# Patient Record
Sex: Female | Born: 1940 | Race: White | Hispanic: No | Marital: Married | Smoking: Former smoker
Health system: Southern US, Community
[De-identification: ages and names within clinical notes are randomized; demographics above are authoritative.]

## PROBLEM LIST (undated history)

## (undated) HISTORY — PX: COSMETIC SURGERY: SHX468

---

## 1989-05-01 HISTORY — PX: TUBAL LIGATION: SHX77

## 1989-05-01 HISTORY — PX: KNEE SURGERY: SHX244

## 2015-03-10 DIAGNOSIS — H40002 Preglaucoma, unspecified, left eye: Secondary | ICD-10-CM | POA: Diagnosis not present

## 2015-03-10 DIAGNOSIS — H179 Unspecified corneal scar and opacity: Secondary | ICD-10-CM | POA: Diagnosis not present

## 2015-03-10 DIAGNOSIS — H2513 Age-related nuclear cataract, bilateral: Secondary | ICD-10-CM | POA: Diagnosis not present

## 2015-03-10 DIAGNOSIS — H43392 Other vitreous opacities, left eye: Secondary | ICD-10-CM | POA: Diagnosis not present

## 2015-06-28 DIAGNOSIS — M9903 Segmental and somatic dysfunction of lumbar region: Secondary | ICD-10-CM | POA: Diagnosis not present

## 2015-06-28 DIAGNOSIS — M9901 Segmental and somatic dysfunction of cervical region: Secondary | ICD-10-CM | POA: Diagnosis not present

## 2015-06-28 DIAGNOSIS — M5413 Radiculopathy, cervicothoracic region: Secondary | ICD-10-CM | POA: Diagnosis not present

## 2015-06-28 DIAGNOSIS — M5432 Sciatica, left side: Secondary | ICD-10-CM | POA: Diagnosis not present

## 2015-07-09 DIAGNOSIS — M9901 Segmental and somatic dysfunction of cervical region: Secondary | ICD-10-CM | POA: Diagnosis not present

## 2015-07-09 DIAGNOSIS — M5432 Sciatica, left side: Secondary | ICD-10-CM | POA: Diagnosis not present

## 2015-07-09 DIAGNOSIS — M5413 Radiculopathy, cervicothoracic region: Secondary | ICD-10-CM | POA: Diagnosis not present

## 2015-07-09 DIAGNOSIS — M9903 Segmental and somatic dysfunction of lumbar region: Secondary | ICD-10-CM | POA: Diagnosis not present

## 2015-07-23 DIAGNOSIS — M5413 Radiculopathy, cervicothoracic region: Secondary | ICD-10-CM | POA: Diagnosis not present

## 2015-07-23 DIAGNOSIS — M5432 Sciatica, left side: Secondary | ICD-10-CM | POA: Diagnosis not present

## 2015-07-23 DIAGNOSIS — M9901 Segmental and somatic dysfunction of cervical region: Secondary | ICD-10-CM | POA: Diagnosis not present

## 2015-07-23 DIAGNOSIS — M9903 Segmental and somatic dysfunction of lumbar region: Secondary | ICD-10-CM | POA: Diagnosis not present

## 2015-12-18 ENCOUNTER — Emergency Department (INDEPENDENT_AMBULATORY_CARE_PROVIDER_SITE_OTHER)
Admission: EM | Admit: 2015-12-18 | Discharge: 2015-12-18 | Disposition: A | Discharge status: Home / Self Care | Payer: Self-pay | Source: Home / Self Care | Attending: Family Medicine | Admitting: Family Medicine

## 2015-12-18 ENCOUNTER — Encounter: Payer: Self-pay | Admitting: Emergency Medicine

## 2015-12-18 DIAGNOSIS — M1990 Unspecified osteoarthritis, unspecified site: Secondary | ICD-10-CM

## 2015-12-18 MED ORDER — SALSALATE 500 MG PO TABS: 1000.0000 mg | ORAL_TABLET | Freq: Two times a day (BID) | ORAL | 1 refills | Status: AC

## 2015-12-18 MED ORDER — TRIAMCINOLONE ACETONIDE 40 MG/ML IJ SUSP
40.0000 mg | Freq: Once | INTRAMUSCULAR | Status: AC
  Administered 2015-12-18: 40 mg via INTRAMUSCULAR

## 2015-12-18 NOTE — ED Triage Notes (Signed)
Pt c/o recurrent RA flare.  Pt is having joint pain all over, pain in left hand, pain in her knees, no appetite and no energy.

## 2015-12-18 NOTE — ED Provider Notes (Signed)
[facility]    CSN: [id number] Arrival date & time: 12/18/15  1641  First Provider Contact:  First MD Initiated Contact with Patient 12/18/15 1723        History   Chief Complaint Chief Complaint  Patient presents with  . Joint Pain    HPI Leslie Guzman is a 75 y.o. female.   Patient complains of 2.5 week history of bilateral knee pain/swelling, and soreness in her upper back and neck after experiencing increased stress.  She states that she had similar arthritis about 20 years ago that was diagnosed by a rheumatologist as having rheumatoid arthritis.  Her symptoms at that time resolved after a single steroid injection, and she continued naturopathic treatments without development of further symptoms.  She states that she had a similar flare-up about 5 years later that again responded to a steroid shot.  She complains of increased fatigue during the past several weeks   The history is provided by the patient.    History reviewed. No pertinent past medical history.  There are no active problems to display for this patient.   Past Surgical History:  Procedure Laterality Date  . COSMETIC SURGERY    . KNEE SURGERY  1991  . TUBAL LIGATION  1991    OB History    No data available       Home Medications    Prior to Admission medications   Medication Sig Start Date End Date Taking? Authorizing Provider  salsalate (DISALCID) 500 MG tablet Take 2 tablets (1,000 mg total) by mouth 2 (two) times daily. Take with food 12/18/15   Kandra Nicolas, MD    Family History No family history on file.  Social History Social History  Substance Use Topics  . Smoking status: Former Research scientist (life sciences)  . Smokeless tobacco: Never Used  . Alcohol use No     Allergies   Review of patient's allergies indicates no known allergies.   Review of Systems Review of Systems  Constitutional: Positive for activity change, fatigue and unexpected weight change. Negative for appetite  change, chills, diaphoresis and fever.  HENT: Negative.   Eyes: Negative.   Respiratory: Negative.   Cardiovascular: Negative.   Gastrointestinal: Negative.   Endocrine: Negative.   Genitourinary: Negative.   Musculoskeletal: Positive for arthralgias, back pain, joint swelling, neck pain and neck stiffness. Negative for gait problem and myalgias.  Skin: Negative.   Neurological: Negative.   Hematological: Negative.      Physical Exam Triage Vital Signs ED Triage Vitals  Enc Vitals Group     BP 12/18/15 1655 131/74     Pulse Rate 12/18/15 1655 70     Resp --      Temp 12/18/15 1655 98.3 F (36.8 C)     Temp Source 12/18/15 1655 Oral     SpO2 12/18/15 1655 99 %     Weight 12/18/15 1655 120 lb 8 oz (54.7 kg)     Height 12/18/15 1655 5\' 3"  (1.6 m)     Head Circumference --      Peak Flow --      Pain Score 12/18/15 1658 7     Pain Loc --      Pain Edu? --      Excl. in [place]? --    No data found.   Updated Vital Signs BP 131/74 (BP Location: Left Arm)   Pulse 70   Temp 98.3 F (36.8 C) (Oral)   Ht 5\' 3"  (1.6 m)  Wt 120 lb 8 oz (54.7 kg)   SpO2 99%   BMI 21.35 kg/m   Visual Acuity Right Eye Distance:   Left Eye Distance:   Bilateral Distance:    Right Eye Near:   Left Eye Near:    Bilateral Near:     Physical Exam  Constitutional: She is oriented to person, place, and time. She appears well-developed and well-nourished. No distress.  HENT:  Head: Normocephalic.  Nose: Nose normal.  Mouth/Throat: Oropharynx is clear and moist.  Eyes: Conjunctivae and EOM are normal. Pupils are equal, round, and reactive to light.  Neck: Normal range of motion. Neck supple. No thyromegaly present.  Cardiovascular: Normal rate, regular rhythm, normal heart sounds and intact distal pulses.   Pulmonary/Chest: Effort normal and breath sounds normal.  Abdominal: Soft. Bowel sounds are normal. There is tenderness.  Musculoskeletal: Normal range of motion. She exhibits edema.        Left knee: She exhibits swelling. She exhibits normal range of motion, no deformity and no erythema. Tenderness found.       Legs: Left knee mildly swollen, warm, and tender to palpation. Right knee mildly swollen and tender to palpation but not warm.  Lymphadenopathy:    She has no cervical adenopathy.  Neurological: She is alert and oriented to person, place, and time.  Skin: Skin is warm and dry. No rash noted.  Nursing note and vitals reviewed.    UC Treatments / Results  Labs (all labs ordered are listed, but only abnormal results are displayed) Labs Reviewed - No data to display  EKG  EKG Interpretation None       Radiology No results found.  Procedures Procedures (including critical care time)  Medications Ordered in UC Medications  triamcinolone acetonide (KENALOG-40) injection 40 mg (40 mg Intramuscular Given 12/18/15 1745)     Initial Impression / Assessment and Plan / UC Course  I have reviewed the triage vital signs and the nursing notes.  Pertinent labs & imaging results that were available during my care of the patient were reviewed by me and considered in my medical decision making (see chart for details).  Clinical Course   Doubt rheumatoid arthritis. Administered Kenalog 40mg  IM. If symptoms persist, Rx written for Salsalate 500mg , two BID Recommend follow-up with PCP or rheumatologist for further workup      Final Clinical Impressions(s) / UC Diagnoses   Final diagnoses:  Arthritis (?etiology)    New Prescriptions Discharge Medication List as of 12/18/2015  5:51 PM    START taking these medications   Details  salsalate (DISALCID) 500 MG tablet Take 2 tablets (1,000 mg total) by mouth 2 (two) times daily. Take with food, Starting Sat 12/18/2015, Print         Kandra Nicolas, MD 12/18/15 [phone]

## 2015-12-27 ENCOUNTER — Encounter: Payer: Self-pay | Admitting: Family Medicine

## 2015-12-27 ENCOUNTER — Ambulatory Visit (INDEPENDENT_AMBULATORY_CARE_PROVIDER_SITE_OTHER): Payer: Medicare Other | Admitting: Family Medicine

## 2015-12-27 ENCOUNTER — Ambulatory Visit (INDEPENDENT_AMBULATORY_CARE_PROVIDER_SITE_OTHER): Payer: Medicare Other

## 2015-12-27 VITALS — BP 140/71 | HR 82 | Temp 98.0°F | Resp 18 | Wt 117.1 lb

## 2015-12-27 DIAGNOSIS — M25562 Pain in left knee: Secondary | ICD-10-CM

## 2015-12-27 DIAGNOSIS — M25561 Pain in right knee: Secondary | ICD-10-CM

## 2015-12-27 DIAGNOSIS — R768 Other specified abnormal immunological findings in serum: Secondary | ICD-10-CM

## 2015-12-27 DIAGNOSIS — M179 Osteoarthritis of knee, unspecified: Secondary | ICD-10-CM | POA: Diagnosis not present

## 2015-12-27 DIAGNOSIS — R700 Elevated erythrocyte sedimentation rate: Secondary | ICD-10-CM

## 2015-12-27 NOTE — Patient Instructions (Addendum)
Thank you for coming in today. Call or go to the ER if you develop a large red swollen joint with extreme pain or oozing puss.  Get labs today.  Return in 2-4 weeks for recheck.    Knee Pain Knee pain is a very common symptom and can have many causes. Knee pain often goes away when you follow your health care provider's instructions for relieving pain and discomfort at home. However, knee pain can develop into a condition that needs treatment. Some conditions may include:  Arthritis caused by wear and tear (osteoarthritis).  Arthritis caused by swelling and irritation (rheumatoid arthritis or gout).  A cyst or growth in your knee.  An infection in your knee joint.  An injury that will not heal.  Damage, swelling, or irritation of the tissues that support your knee (torn ligaments or tendinitis). If your knee pain continues, additional tests may be ordered to diagnose your condition. Tests may include X-rays or other imaging studies of your knee. You may also need to have fluid removed from your knee. Treatment for ongoing knee pain depends on the cause, but treatment may include:  Medicines to relieve pain or swelling.  Steroid injections in your knee.  Physical therapy.  Surgery. HOME CARE INSTRUCTIONS  Take medicines only as directed by your health care provider.  Rest your knee and keep it raised (elevated) while you are resting.  Do not do things that cause or worsen pain.  Avoid high-impact activities or exercises, such as running, jumping rope, or doing jumping jacks.  Apply ice to the knee area:  Put ice in a plastic bag.  Place a towel between your skin and the bag.  Leave the ice on for 20 minutes, 2-3 times a day.  Ask your health care provider if you should wear an elastic knee support.  Keep a pillow under your knee when you sleep.  Lose weight if you are overweight. Extra weight can put pressure on your knee.  Do not use any tobacco products,  including cigarettes, chewing tobacco, or electronic cigarettes. If you need help quitting, ask your health care provider. Smoking may slow the healing of any bone and joint problems that you may have. SEEK MEDICAL CARE IF:  Your knee pain continues, changes, or gets worse.  You have a fever along with knee pain.  Your knee buckles or locks up.  Your knee becomes more swollen. SEEK IMMEDIATE MEDICAL CARE IF:   Your knee joint feels hot to the touch.  You have chest pain or trouble breathing.   This information is not intended to replace advice given to you by your health care provider. Make sure you discuss any questions you have with your health care provider.   Document Released: 02/12/2007 Document Revised: 05/08/2014 Document Reviewed: 12/01/2013 Elsevier Interactive Patient Education 2016 Elsevier Inc.   Rheumatoid Arthritis Rheumatoid arthritis is a long-term (chronic) inflammatory disease that causes pain, swelling, and stiffness of the joints. It can affect the entire body, including the eyes and lungs. The effects of rheumatoid arthritis vary widely among those with the condition. CAUSES The cause of rheumatoid arthritis is not known. It tends to run in families and is more common in women. Certain cells of the body's natural defense system (immune system) do not work properly and begin to attack healthy joints. It primarily involves the connective tissue that lines the joints (synovial membrane). This can cause damage to the joint. SYMPTOMS  Pain, stiffness, swelling, and decreased motion of  many joints, especially in the hands and feet.  Stiffness that is worse in the morning. It may last 1-2 hours or longer.  Numbness and tingling in the hands.  Fatigue.  Loss of appetite.  Weight loss.  Low-grade fever.  Dry eyes and mouth.  Firm lumps (rheumatoid nodules) that grow beneath the skin in areas such as the elbows and hands. DIAGNOSIS Diagnosis is based on the  symptoms described, an exam, and blood tests. Sometimes, X-rays are helpful. TREATMENT The goals of treatment are to relieve pain, reduce inflammation, and to slow down or stop joint damage and disability. Methods vary and may include:  Maintaining a balance of rest, exercise, and proper nutrition.  Your health care provider may adjust your medicines every 3 months until treatment goals are reached. Common medicines include:  Pain relievers (analgesics).  Corticosteroids and nonsteroidal anti-inflammatory drugs (NSAIDs) to reduce inflammation.  Disease-modifying antirheumatic drugs (DMARDs) to try to slow the course of the disease.  Biologic response modifiers to reduce inflammation and damage.  Physical therapy and occupational therapy.  Surgery for patients with severe joint damage. Joint replacement or fusing of joints may be needed.  Routine monitoring and ongoing care, such as office visits, blood and urine tests, and X-rays. Your health care provider will work with you to identify the best treatment option for you, based on an assessment of the overall disease activity in your body. HOME CARE INSTRUCTIONS  Remain physically active and reduce activity when the disease gets worse.  Eat a well-balanced diet.  Put heat on affected joints when you wake up and before activities. Keep the heat on the affected joint for as long as directed by your health care provider.  Put ice on affected joints following activities or exercising.  Put ice in a plastic bag.  Place a towel between your skin and the bag.  Leave the ice on for 15-20 minutes, 3-4 times per day, or as directed by your health care provider.  Take medicines and supplements only as directed by your health care provider.  Use splints as directed by your health care provider. Splints help maintain joint position and function.  Do not sleep with pillows under your knees. This may lead to spasms.  Participate in a  self-management program to keep current with the latest treatment and coping skills. SEEK IMMEDIATE MEDICAL CARE IF:  You have fainting episodes.  You have periods of extreme weakness.  You rapidly develop a hot, painful joint that is more severe than usual joint aches.  You have chills.  You have a fever. FOR MORE INFORMATION  American College of Rheumatology: www.rheumatology.[place]: www.arthritis.org   This information is not intended to replace advice given to you by your health care provider. Make sure you discuss any questions you have with your health care provider.   Document Released: 04/14/2000 Document Revised: 05/08/2014 Document Reviewed: 05/24/2011 Elsevier Interactive Patient Education [employer].

## 2015-12-27 NOTE — Progress Notes (Signed)
Pt having bilat. Knee pain.  Denies injury.  Difficulty moving and walking.  Pain level 7-8.

## 2015-12-27 NOTE — Progress Notes (Signed)
Leslie Guzman is a 75 y.o. female who presents to [facility] today for bilateral knee pain. Patient has a few week history of severe bilateral knee pain and swelling. Prior to the onset of pain she was doing reasonably well and able to maintain normal activities such as hiking and walking and cycling. She denies any injury to explain the pain. She's had similar episodes in the past that she attributes to rheumatoid arthritis. She's had a diagnosis in the past that she cannot recall exactly how was diagnosed. She does not take any medications chronically for her possible rheumatoid arthritis. She's been taking ibuprofen as well as aspirin for pain which helps a bit. The pain is worse with activity but only mildly improved with rest. The pain is interfering with activities and is quite bothersome.   No past medical history on file. Past Surgical History:  Procedure Laterality Date  . COSMETIC SURGERY    . KNEE SURGERY  1991  . TUBAL LIGATION  1991   Social History  Substance Use Topics  . Smoking status: Former Research scientist (life sciences)  . Smokeless tobacco: Never Used  . Alcohol use No   family history is not on file.  ROS:  No headache, visual changes, nausea, vomiting, diarrhea, constipation, dizziness, abdominal pain, skin rash, fevers, chills, night sweats, weight loss, swollen lymph nodes, body aches, joint swelling, muscle aches, chest pain, shortness of breath, mood changes, visual or auditory hallucinations.    Medications: Current Outpatient Prescriptions  Medication Sig Dispense Refill  . salsalate (DISALCID) 500 MG tablet Take 2 tablets (1,000 mg total) by mouth 2 (two) times daily. Take with food 60 tablet 1   No current facility-administered medications for this visit.    No Known Allergies   Exam:  BP 140/71 (BP Location: Right Arm, Patient Position: Sitting, Cuff Size: Normal)   Pulse 82   Temp 98 F (36.7 C) (Oral)   Resp 18   Wt  117 lb 1.9 oz (53.1 kg)   SpO2 99%   BMI 20.75 kg/m  General: Well Developed, well nourished, and in no acute distress.  Neuro/Psych: Alert and oriented x3, extra-ocular muscles intact, able to move all 4 extremities, sensation grossly intact. Skin: Warm and dry, no rashes noted.  Respiratory: Not using accessory muscles, speaking in full sentences, trachea midline.  Cardiovascular: Pulses palpable, no extremity edema. Abdomen: Does not appear distended. MSK: Moderate effusion bilateral knees. Knees are nontender. Normal motion with 1+  patellar crepitations present bilaterally. Stable ligamentous exam Painful gait  Procedure: Real-time Ultrasound Guided Injection of right knee  Device: GE Logiq E  Images permanently stored and available for review in the ultrasound unit. Verbal informed consent obtained. Discussed risks and benefits of procedure. Warned about infection bleeding damage to structures skin hypopigmentation and fat atrophy among others. Patient expresses understanding and agreement Time-out conducted.  Noted no overlying erythema, induration, or other signs of local infection.  Skin prepped in a sterile fashion.  Local anesthesia: Topical Ethyl chloride.  With sterile technique and under real time ultrasound guidance: 60 mg of Kenalog and 4 mL of Marcaine injected easily.  Completed without difficulty  Pain immediately resolved suggesting accurate placement of the medication.  Advised to call if fevers/chills, erythema, induration, drainage, or persistent bleeding.  Images permanently stored and available for review in the ultrasound unit.  Impression: Technically successful ultrasound guided injection.   Procedure: Real-time Ultrasound Guided Injection of left knee  Device: GE Logiq E  Images permanently stored and available for review in the ultrasound unit. Verbal informed consent obtained. Discussed risks and benefits of procedure. Warned about  infection bleeding damage to structures skin hypopigmentation and fat atrophy among others. Patient expresses understanding and agreement Time-out conducted.  Noted no overlying erythema, induration, or other signs of local infection.  Skin prepped in a sterile fashion.  Local anesthesia: Topical Ethyl chloride.  With sterile technique and under real time ultrasound guidance: 60 mg of Kenalog and 4 mL of Marcaine injected easily.  Completed without difficulty  Pain immediately resolved suggesting accurate placement of the medication.  Advised to call if fevers/chills, erythema, induration, drainage, or persistent bleeding.  Images permanently stored and available for review in the ultrasound unit.  Impression: Technically successful ultrasound guided injection.    No results found for this or any previous visit (from the past 24 hour(s)). Dg Knee Complete 4 Views Left  Result Date: 12/27/2015 CLINICAL DATA:  Bilateral knee pain. EXAM: LEFT KNEE - COMPLETE 4+ VIEW COMPARISON:  None. FINDINGS: Mild joint space narrowing within the medial and patellofemoral compartments. No significant spurring. Small joint effusion. No acute bony abnormality. Specifically, no fracture, subluxation, or dislocation. Soft tissues are intact. IMPRESSION: Mild degenerative changes with joint space narrowing in the medial and patellofemoral compartments. No acute bony abnormality. Electronically Signed   By: Rolm Baptise M.D.   On: 12/27/2015 14:44     Assessment and Plan: 75 y.o. female with bilateral knee pain very likely due to osteoarthritis is seen on x-ray. Patient thinks she has a history of rheumatoid arthritis which is a possibility. We'll obtain a workup including a orders listed below. Patient had immediate pain relief following injections. Return in 2-4 weeks.  Orders Placed This Encounter  Procedures  . DG Knee Complete 4 Views Left    Please include patellar sunrise, lateral, and  weightbearing bilateral AP and bilateral rosenberg views    Standing Status:   Future    Number of Occurrences:   1    Standing Expiration Date:   02/25/2017    Order Specific Question:   Reason for exam:    Answer:   Please include patellar sunrise, lateral, and weightbearing bilateral AP and bilateral rosenberg views    Comments:   Please include patellar sunrise, lateral, and weightbearing bilateral AP and bilateral rosenberg views    Order Specific Question:   Preferred imaging location?    Answer:   [facility]  . DG Knee Complete 4 Views Right    Please include patellar sunrise, lateral, and weightbearing bilateral AP and bilateral rosenberg views    Standing Status:   Future    Number of Occurrences:   1    Standing Expiration Date:   02/25/2017    Order Specific Question:   Reason for exam:    Answer:   Please include patellar sunrise, lateral, and weightbearing bilateral AP and bilateral rosenberg views    Comments:   Please include patellar sunrise, lateral, and weightbearing bilateral AP and bilateral rosenberg views    Order Specific Question:   Preferred imaging location?    Answer:   [facility]  . CBC with Differential/Platelet  . Comprehensive metabolic panel  . Sedimentation rate  . Rheumatoid factor  . ANA  . CK  . Uric acid  . Anti-DNA antibody, double-stranded      Discussed warning signs or symptoms. Please see discharge instructions. Patient expresses understanding.

## 2015-12-28 ENCOUNTER — Encounter: Payer: Self-pay | Admitting: Family Medicine

## 2015-12-28 DIAGNOSIS — N183 Chronic kidney disease, stage 3 unspecified: Secondary | ICD-10-CM | POA: Insufficient documentation

## 2015-12-28 DIAGNOSIS — R700 Elevated erythrocyte sedimentation rate: Secondary | ICD-10-CM | POA: Insufficient documentation

## 2015-12-28 DIAGNOSIS — D649 Anemia, unspecified: Secondary | ICD-10-CM | POA: Insufficient documentation

## 2015-12-28 DIAGNOSIS — R768 Other specified abnormal immunological findings in serum: Secondary | ICD-10-CM | POA: Insufficient documentation

## 2015-12-28 LAB — CBC WITH DIFFERENTIAL/PLATELET
Basophils Absolute: 82 cells/uL (ref 0–200)
Basophils Relative: 1 %
EOS ABS: 164 {cells}/uL (ref 15–500)
Eosinophils Relative: 2 %
HEMATOCRIT: 33.4 % — AB (ref 35.0–45.0)
HEMOGLOBIN: 11 g/dL — AB (ref 11.7–15.5)
LYMPHS ABS: 1066 {cells}/uL (ref 850–3900)
LYMPHS PCT: 13 %
MCH: 31.9 pg (ref 27.0–33.0)
MCHC: 32.9 g/dL (ref 32.0–36.0)
MCV: 96.8 fL (ref 80.0–100.0)
MONO ABS: 984 {cells}/uL — AB (ref 200–950)
MPV: 9.8 fL (ref 7.5–12.5)
Monocytes Relative: 12 %
NEUTROS PCT: 72 %
Neutro Abs: 5904 cells/uL (ref 1500–7800)
Platelets: 583 10*3/uL — ABNORMAL HIGH (ref 140–400)
RBC: 3.45 MIL/uL — AB (ref 3.80–5.10)
RDW: 13.3 % (ref 11.0–15.0)
WBC: 8.2 10*3/uL (ref 3.8–10.8)

## 2015-12-28 LAB — ANA: Anti Nuclear Antibody(ANA): POSITIVE — AB

## 2015-12-28 LAB — COMPREHENSIVE METABOLIC PANEL
ALK PHOS: 50 U/L (ref 33–130)
ALT: 11 U/L (ref 6–29)
AST: 15 U/L (ref 10–35)
Albumin: 3.9 g/dL (ref 3.6–5.1)
BILIRUBIN TOTAL: 0.3 mg/dL (ref 0.2–1.2)
BUN: 21 mg/dL (ref 7–25)
CO2: 26 mmol/L (ref 20–31)
Calcium: 10 mg/dL (ref 8.6–10.4)
Chloride: 104 mmol/L (ref 98–110)
Creat: 1.11 mg/dL — ABNORMAL HIGH (ref 0.60–0.93)
GLUCOSE: 95 mg/dL (ref 65–99)
POTASSIUM: 4.6 mmol/L (ref 3.5–5.3)
Sodium: 141 mmol/L (ref 135–146)
Total Protein: 7.6 g/dL (ref 6.1–8.1)

## 2015-12-28 LAB — ANTI-NUCLEAR AB-TITER (ANA TITER)

## 2015-12-28 LAB — RHEUMATOID FACTOR: Rhuematoid fact SerPl-aCnc: <10 IU/mL (ref <=14)

## 2015-12-28 LAB — CK: CK TOTAL: 29 U/L (ref 7–177)

## 2015-12-28 LAB — ANTI-DNA ANTIBODY, DOUBLE-STRANDED: ds DNA Ab: 1 IU/mL

## 2015-12-28 LAB — URIC ACID: Uric Acid, Serum: 5.4 mg/dL (ref 2.5–7.0)

## 2015-12-28 LAB — SEDIMENTATION RATE: SED RATE: 97 mm/h — AB (ref 0–30)

## 2015-12-28 NOTE — Addendum Note (Signed)
Addended by: Gregor Hams on: 12/28/2015 12:41 PM   Modules accepted: Orders

## 2016-01-27 ENCOUNTER — Encounter: Payer: Self-pay | Admitting: Family Medicine

## 2016-01-27 ENCOUNTER — Ambulatory Visit (INDEPENDENT_AMBULATORY_CARE_PROVIDER_SITE_OTHER): Payer: Medicare Other

## 2016-01-27 ENCOUNTER — Ambulatory Visit (INDEPENDENT_AMBULATORY_CARE_PROVIDER_SITE_OTHER): Payer: Medicare Other | Admitting: Family Medicine

## 2016-01-27 VITALS — BP 148/72 | HR 72 | Wt 114.0 lb

## 2016-01-27 DIAGNOSIS — M1612 Unilateral primary osteoarthritis, left hip: Secondary | ICD-10-CM | POA: Diagnosis not present

## 2016-01-27 DIAGNOSIS — M25552 Pain in left hip: Secondary | ICD-10-CM

## 2016-01-27 NOTE — Patient Instructions (Signed)
Thank you for coming in today. Attend PT.  Return in a few weeks or sooner if needed.    Hip Pain Your hip is the joint between your upper legs and your lower pelvis. The bones, cartilage, tendons, and muscles of your hip joint perform a lot of work each day supporting your body weight and allowing you to move around. Hip pain can range from a minor ache to severe pain in one or both of your hips. Pain may be felt on the inside of the hip joint near the groin, or the outside near the buttocks and upper thigh. You may have swelling or stiffness as well.  HOME CARE INSTRUCTIONS   Take medicines only as directed by your health care provider.  Apply ice to the injured area:  Put ice in a plastic bag.  Place a towel between your skin and the bag.  Leave the ice on for 15-20 minutes at a time, 3-4 times a day.  Keep your leg raised (elevated) when possible to lessen swelling.  Avoid activities that cause pain.  Follow specific exercises as directed by your health care provider.  Sleep with a pillow between your legs on your most comfortable side.  Record how often you have hip pain, the location of the pain, and what it feels like. SEEK MEDICAL CARE IF:   You are unable to put weight on your leg.  Your hip is red or swollen or very tender to touch.  Your pain or swelling continues or worsens after 1 week.  You have increasing difficulty walking.  You have a fever. SEEK IMMEDIATE MEDICAL CARE IF:   You have fallen.  You have a sudden increase in pain and swelling in your hip. MAKE SURE YOU:   Understand these instructions.  Will watch your condition.  Will get help right away if you are not doing well or get worse.   This information is not intended to replace advice given to you by your health care provider. Make sure you discuss any questions you have with your health care provider.   Document Released: 10/05/2009 Document Revised: 05/08/2014 Document Reviewed:  12/12/2012 Elsevier Interactive Patient Education [employer].

## 2016-01-27 NOTE — Progress Notes (Signed)
Leslie Guzman is a 75 y.o. female who presents to [place]: [place] today for follow up of bilateral knee pain and new onset left hip pain.  Patient was seen in August for bilateral knee pain.  Her Xrays were significant for osteoarthritis and she was given bilateral steroid injections.  She reports good relief of pain since then.    Patient now has left hip pain.  She reports her pain is just deep to her lateral anterior thigh.  The pain began 10 days ago.  She denies known injury or unusual activity that may have caused her pain. Her pain is worse with activities that flex her hip such as walking, climbing stairs, and getting into a car.  She endorses a history of similar pain in the left hip but claims it went away over 6 months ago.  Patient is unsure of the etiology of her pain at that time, but she did say she went to a chiropractor and did home therapy exercises which helped.  She denies radicular pain.  No numbness, tingling, or weakness.     No past medical history on file. Past Surgical History:  Procedure Laterality Date  . COSMETIC SURGERY    . KNEE SURGERY  1991  . TUBAL LIGATION  1991   Social History  Substance Use Topics  . Smoking status: Former Research scientist (life sciences)  . Smokeless tobacco: Never Used  . Alcohol use No   family history is not on file.  ROS as above:  Medications: Current Outpatient Prescriptions  Medication Sig Dispense Refill  . salsalate (DISALCID) 500 MG tablet Take 2 tablets (1,000 mg total) by mouth 2 (two) times daily. Take with food 60 tablet 1   No current facility-administered medications for this visit.    No Known Allergies   Exam:  BP (!) 148/72   Pulse 72   Wt 114 lb (51.7 kg)   BMI 20.19 kg/m  Gen: Well NAD Left hip:  Normal appearing without effusion Non-tender to palpation Full range of motion Increased pain with flexion and  external rotation Strength intact Antalgic gait favoring left leg   Dg Hip Unilat With Pelvis 2-3 Views Left  Result Date: 01/27/2016 CLINICAL DATA:  O awakened with left hip pain 10 days ago. No known injury. EXAM: DG HIP (WITH OR WITHOUT PELVIS) 2-3V LEFT COMPARISON:  None in PACs FINDINGS: The bones are subjectively osteopenic. No lytic or blastic pelvic lesion is observed. AP and lateral views of the left hip reveal mild narrowing of the joint space medially. The articular surfaces of the femoral head and acetabulum remains smoothly rounded. No definite osteochondral lesions are observed. The femoral neck, intertrochanteric, and subtrochanteric regions are normal. 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 No definite bony abnormalities. Electronically Signed   By: David  Martinique M.D.   On: 01/27/2016 15:21     Assessment and Plan: 75 y.o. female with:  1.  New left hip pain concerning for a muscular strain.  Left hip Xray showed mild arthritis and could certainly be contributing some to her pain.  We will try formal physical therapy since it has helped in the past.  She can take tylenol, ibuprofen, and aleve for pain and inflammation.  If not better in a few weeks, we can consider hip injection.  2.  Bilateral knee pain, likely from osteoarthritis.  Well controlled after bilateral injections last month.  Will continue to monitor  No orders of the defined types were placed in this encounter.   Discussed warning signs or symptoms. Please see discharge instructions. Patient expresses understanding.

## 2016-02-01 ENCOUNTER — Encounter: Payer: Self-pay | Admitting: Rehabilitative and Restorative Service Providers"

## 2016-02-01 ENCOUNTER — Ambulatory Visit (INDEPENDENT_AMBULATORY_CARE_PROVIDER_SITE_OTHER): Payer: Medicare Other | Admitting: Rehabilitative and Restorative Service Providers"

## 2016-02-01 DIAGNOSIS — M25552 Pain in left hip: Secondary | ICD-10-CM

## 2016-02-01 DIAGNOSIS — R29898 Other symptoms and signs involving the musculoskeletal system: Secondary | ICD-10-CM | POA: Diagnosis not present

## 2016-02-01 NOTE — Therapy (Signed)
[place], [place], [address] Phone: [phone]   Fax:  [phone]  Physical Therapy Evaluation  Patient Details  Name: Leslie Guzman MRN: [id number] Date of Birth: July 23, 1940 Referring Provider: Dr Lynne Leader   Encounter Date: 02/01/2016      PT End of Session - 02/01/16 1255    Visit Number 1   Number of Visits 6   Date for PT Re-Evaluation 03/14/16   PT Start Time [id number]   PT Stop Time 1253   PT Time Calculation (min) 65 min   Activity Tolerance Patient tolerated treatment well      History reviewed. No pertinent past medical history.  Past Surgical History:  Procedure Laterality Date  . COSMETIC SURGERY    . KNEE SURGERY  1991  . TUBAL LIGATION  1991    There were no vitals filed for this visit.       Subjective Assessment - 02/01/16 1153    Subjective Patient reports that she has had intermittent Lt hip pain intermittently for the past 3-4 years after she did lunges with a weight. Symptoms increased in the past couple of years. She was seen by a chiropractor with good resolution for the past 6 months. She awoke ~ 2 weeks ago with Lt hip pain which has persisted.    Pertinent History history of arthritis in multiple joints ~20 years ago - now resolved. Some intermittent pain in bilat knees recently treated with injections   How long can you stand comfortably? 15-20 min    How long can you walk comfortably? not at all    Diagnostic tests xrays - arthritic changes in the Lt hip    Patient Stated Goals get rid og the hip pain    Currently in Pain? Yes   Pain Score 4    Pain Location Hip   Pain Orientation Left   Pain Descriptors / Indicators Dull;Aching   Pain Type Chronic pain   Pain Radiating Towards just in hip posterio and anterior    Pain Onset More than a month ago   Pain Frequency Intermittent   Aggravating Factors  standing and walking; squatting(due to knee pain);  moving from sit to stand mostly    Pain Relieving Factors advil             [facility] PT Assessment - 02/01/16 0001      Assessment   Medical Diagnosis Lt hip pain    Referring Provider Dr Lynne Leader    Onset Date/Surgical Date 01/14/16   Hand Dominance Left   Next MD Visit prn    Prior Therapy chiropractic care for hip      Precautions   Precautions None     Balance Screen   Has the patient fallen in the past 6 months No   Has the patient had a decrease in activity level because of a fear of falling?  No   Is the patient reluctant to leave their home because of a fear of falling?  No     Home Environment   Additional Comments single level home      Prior Function   Level of Independence Independent   Vocation Retired   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   Leisure household chores; walking daily 30 min; eliptical 5 min in the past week      Observation/Other Assessments   Focus on Therapeutic Outcomes (FOTO)  61% limitatioin  Sensation   Additional Comments WFL's      Posture/Postural Control   Posture Comments head forward; shoulders rounded and elevated; weight shifted to the Rt      AROM   Overall AROM Comments limited ROM bilat knees at ~ 95-100 deg flexion and (-) 10 deg extension; tightness through bilat hips Lt > Rt with limited hip ext on Lt at neutral for 10 deg     Strength   Overall Strength Comments 5/5 bilat LE's except Lt hip abd 5-/5 and hip ext 4+/5      Flexibility   Hamstrings hamstrings 85 to 90 deg   Quadriceps knee flexion ~ 95-100 deg bilat    ITB tight Lt    Piriformis tight Lt piriformis      Palpation   Palpation comment significant tightness noted through Lt lateral sacral border/piriformis and glut medius along the path of the muscle to the posterior greater trochanter; Lt iliopsoas      Special Tests    Special Tests --  tight figure 4  and Thomas tests Lt                    [facility]  Adult PT Treatment/Exercise - 02/01/16 0001      Therapeutic Activites    Therapeutic Activities --  self massage using 4 inch ball      Lumbar Exercises: Stretches   Passive Hamstring Stretch 30 seconds;1 rep   Hip Flexor Stretch 3 reps;30 seconds   ITB Stretch 3 reps;30 seconds   Piriformis Stretch 3 reps;30 seconds  varied angles for stretch      Moist Heat Therapy   Number Minutes Moist Heat 15 Minutes   Moist Heat Location Lumbar Spine;Hip     Electrical Stimulation   Electrical Stimulation Location posterior Lt hip piriformis    Electrical Stimulation Action IFC   Electrical Stimulation Parameters to tolerance   Electrical Stimulation Goals Pain;Tone                PT Education - 02/01/16 1221    Education provided Yes   Education Details HEP TENS    Person(s) Educated Patient   Methods Explanation;Demonstration;Tactile cues;Verbal cues;Handout   Comprehension Verbalized understanding;Returned demonstration;Verbal cues required;Tactile cues required             PT Long Term Goals - 02/01/16 1301      PT LONG TERM GOAL #1   Title Improve mobility through the Lt hip with patient to demonstrate equal flexibilty Lt to Rt 03/14/16   Time 6   Period Weeks   Status New     PT LONG TERM GOAL #2   Title Increase strength Lt hip to 5-/5 to 5/5 hip extension and hip abduction 03/14/16   Time 6   Period Weeks   Status New     PT LONG TERM GOAL #3   Title Independent in HEP 03/14/16   Time 6   Period Weeks   Status New     PT LONG TERM GOAL #4   Title Improve FOTO to </= 39% limitation 03/14/16   Time 6   Period Weeks   Status New               Plan - 02/01/16 1255    Clinical Impression Statement Patient presents with Lt hip pain and tightness of ~ 2 weeks acute flare up. Symptoms have been present intermittently for the past 2 or more years. Patient has limited trunk and  LE ROM; muscular tightness through the Lt posterior/anterior hip  musclature; pain with limited Lt hip ROM.    Rehab Potential Good   PT Frequency 2x / week   PT Duration 6 weeks   PT Treatment/Interventions Patient/family education;ADLs/Self Care Home Management;Cryotherapy;Electrical Stimulation;Iontophoresis 4mg /ml Dexamethasone;Moist Heat;Traction;Ultrasound;Dry needling;Manual techniques;Therapeutic activities;Therapeutic exercise   PT Next Visit Plan manual work v TDN to Lt hip musculature; progress piriformis and hip flexor stretches; strengthening; HEP    Consulted and Agree with Plan of Care Patient      Patient will benefit from skilled therapeutic intervention in order to improve the following deficits and impairments:  Postural dysfunction, Improper body mechanics, Pain, Increased muscle spasms, Increased fascial restricitons, Decreased range of motion, Decreased mobility, Decreased strength, Decreased activity tolerance  Visit Diagnosis: Pain in left hip - Plan: PT plan of care cert/re-cert  Other symptoms and signs involving the musculoskeletal system - Plan: PT plan of care cert/re-cert     Problem List Patient Active Problem List   Diagnosis Date Noted  . Elevated sed rate 12/28/2015  . CKD (chronic kidney disease), stage III 12/28/2015  . Anemia 12/28/2015  . Positive ANA (antinuclear antibody) 12/28/2015  . Knee pain, bilateral 12/27/2015    Juwana Thoreson Nilda Simmer PT, MPH  02/01/2016, 1:06 PM  [facility] [place], [place], [address] Phone: [phone]   Fax:  [phone]  Name: Leslie Guzman MRN: [id number] Date of Birth: 1941/03/27

## 2016-02-01 NOTE — Patient Instructions (Signed)
Self massage using a 4 inch rubber ball   HIP: Hamstrings - Supine   Place strap around foot. Raise leg up, keeping knee straight.  Bend opposite knee to protect back if indicated. Hold 30 seconds. 3 reps per set, 2-3 sets per day     Outer Hip Stretch: Reclined IT Band Stretch (Strap)   Strap around one foot, pull leg across body until you feel a pull or stretch, with shoulders on mat. Hold for 30 seconds. Repeat 3 times each leg. 2-3 times/day.  Piriformis Stretch   Lying on back, pull right knee toward opposite shoulder. Hold 30 seconds. Repeat 3 times. Do 2-3 sessions per day.   Quads / HF, Supine   Lie near edge of bed, pull both knees up toward chest. Hold one knee as you drop the other leg off the edge of the bed.  Relax hanging knee/can bend knee back if indicated. Hold 30 seconds. Repeat 3 times per session. Do 2-3 sessions per day.   TENS UNIT: This is helpful for muscle pain and spasm.   Search and Purchase a TENS 7000 2nd edition at www.tenspros.com. It should be less than $30.     TENS unit instructions: Do not shower or bathe with the unit on Turn the unit off before removing electrodes or batteries If the electrodes lose stickiness add a drop of water to the electrodes after they are disconnected from the unit and place on plastic sheet. If you continued to have difficulty, call the TENS unit company to purchase more electrodes. Do not apply lotion on the skin area prior to use. Make sure the skin is clean and dry as this will help prolong the life of the electrodes. After use, always check skin for unusual red areas, rash or other skin difficulties. If there are any skin problems, does not apply electrodes to the same area. Never remove the electrodes from the unit by pulling the wires. Do not use the TENS unit or electrodes other than as directed. Do not change electrode placement without consultating your therapist or physician. Keep 2 fingers with  between each electrode.

## 2016-02-07 ENCOUNTER — Ambulatory Visit: Payer: Medicare Other | Admitting: Rehabilitative and Restorative Service Providers"

## 2016-02-11 ENCOUNTER — Ambulatory Visit (INDEPENDENT_AMBULATORY_CARE_PROVIDER_SITE_OTHER): Payer: Medicare Other | Admitting: Rehabilitative and Restorative Service Providers"

## 2016-02-11 ENCOUNTER — Encounter: Payer: Self-pay | Admitting: Rehabilitative and Restorative Service Providers"

## 2016-02-11 DIAGNOSIS — M25552 Pain in left hip: Secondary | ICD-10-CM | POA: Diagnosis present

## 2016-02-11 DIAGNOSIS — R29898 Other symptoms and signs involving the musculoskeletal system: Secondary | ICD-10-CM | POA: Diagnosis not present

## 2016-02-11 NOTE — Therapy (Addendum)
[place], [place], [address] Phone: [phone]   Fax:  [phone]  Physical Therapy Treatment  Patient Details  Name: Leslie Guzman MRN: [id number] Date of Birth: 1940-07-29 Referring Provider: Dr Lynne Leader  Encounter Date: 02/11/2016      PT End of Session - 02/11/16 1152    Visit Number 2   Number of Visits 6   Date for PT Re-Evaluation 03/14/16   PT Start Time 1150   PT Stop Time 1246   PT Time Calculation (min) 56 min   Activity Tolerance Patient tolerated treatment well      History reviewed. No pertinent past medical history.  Past Surgical History:  Procedure Laterality Date  . COSMETIC SURGERY    . KNEE SURGERY  1991  . TUBAL LIGATION  1991    There were no vitals filed for this visit.      Subjective Assessment - 02/11/16 1152    Subjective Patient reports that she has been inconsistent with HEP. She has continued diffuculty with getting up to walk. She reports that her hip feels fairly good since the therapy visit.    Currently in Pain? --  bilat knees stiff and sore 2/10    Pain Score 0-No pain   Pain Location Hip            [facility] PT Assessment - 02/11/16 0001      Assessment   Medical Diagnosis Lt hip pain    Referring Provider Dr Lynne Leader   Onset Date/Surgical Date 01/14/16   Hand Dominance Left   Next MD Visit prn    Prior Therapy chiropractic care for hip      Observation/Other Assessments   Focus on Therapeutic Outcomes (FOTO)  55% limitation      Strength   Overall Strength Comments 5/5 bilat LE's except Lt hip abd 5-/5 and hip ext 4+/5      Flexibility   Hamstrings hamstrings 85 to 90 deg   Quadriceps knee flexion ~ 95-100 deg bilat    ITB tight Lt    Piriformis tight Lt piriformis      Palpation   Palpation comment moderate tightness noted through Lt lateral sacral border/piriformis and glut medius along the path of the muscle to the  posterior greater trochanter; Lt iliopsoas                      [facility] Adult PT Treatment/Exercise - 02/11/16 0001      Lumbar Exercises: Stretches   Passive Hamstring Stretch 30 seconds;1 rep   Hip Flexor Stretch 3 reps;30 seconds   ITB Stretch 3 reps;30 seconds   Piriformis Stretch 3 reps;30 seconds  varied angles for stretch      Lumbar Exercises: Aerobic   Stationary Bike nustep L4 x 4 min      Lumbar Exercises: Supine   Clam 20 reps   Clam Limitations added red TB for 5 reps    Bridge 2 seconds;20 reps     Moist Heat Therapy   Number Minutes Moist Heat 15 Minutes   Moist Heat Location Lumbar Spine;Hip     Electrical Stimulation   Electrical Stimulation Location posterior Lt hip piriformis    Electrical Stimulation Action IFC   Electrical Stimulation Parameters to tolerance   Electrical Stimulation Goals Pain;Tone                PT Education - 02/11/16 1206    Education provided  Yes   Education Details HEP TENS instruction    Person(s) Educated Patient   Methods Explanation   Comprehension Verbalized understanding             PT Long Term Goals - 02/11/16 1209      PT LONG TERM GOAL #1   Title Improve mobility through the Lt hip with patient to demonstrate equal flexibilty Lt to Rt 03/14/16   Time 6   Period Weeks   Status On-going     PT LONG TERM GOAL #2   Title Increase strength Lt hip to 5-/5 to 5/5 hip extension and hip abduction 03/14/16   Time 6   Period Weeks   Status On-going     PT LONG TERM GOAL #3   Title Independent in HEP 03/14/16   Time 6   Period Weeks   Status Achieved     PT LONG TERM GOAL #4   Title Improve FOTO to </= 39% limitation 03/14/16   Time 6   Period Weeks   Status On-going               Plan - 02/11/16 1241    Clinical Impression Statement Patient reports improvement in Lt hip pain following initial PT visit. She demonstrates improved mobility in hip and reports less pain. Goals of  therapy are partially accomplished.    Rehab Potential Good   PT Frequency 2x / week   PT Duration 6 weeks   PT Treatment/Interventions Patient/family education;ADLs/Self Care Home Management;Cryotherapy;Electrical Stimulation;Iontophoresis 13m/ml Dexamethasone;Moist Heat;Traction;Ultrasound;Dry needling;Manual techniques;Therapeutic activities;Therapeutic exercise   PT Next Visit Plan manual work v TDN to Lt hip musculature; progress piriformis and hip flexor stretches; strengthening; HEP    Consulted and Agree with Plan of Care Patient      Patient will benefit from skilled therapeutic intervention in order to improve the following deficits and impairments:  Postural dysfunction, Improper body mechanics, Pain, Increased muscle spasms, Increased fascial restricitons, Decreased range of motion, Decreased mobility, Decreased strength, Decreased activity tolerance  Visit Diagnosis: Pain in left hip  Other symptoms and signs involving the musculoskeletal system     Problem List Patient Active Problem List   Diagnosis Date Noted  . Elevated sed rate 12/28/2015  . CKD (chronic kidney disease), stage III 12/28/2015  . Anemia 12/28/2015  . Positive ANA (antinuclear antibody) 12/28/2015  . Knee pain, bilateral 12/27/2015    Shady Bradish PNilda SimmerPT, MPH  02/11/2016, 12:59 PM  [facility] [address] [address]Phone: [phone]  Fax:  [phone] Name: Leslie QuebedeauxMRN: [id number] of Birth: 1Dec 09, 1942  PHYSICAL THERAPY DISCHARGE SUMMARY  Visits from Start of Care: 2  Current functional level related to goals / functional outcomes: Less pain to palpation; improved muscular tightness; less pain    Remaining deficits: Continued tightness    Education / Equipment: HEP  Plan: Patient agrees to discharge.  Patient goals were partially met. Patient is being discharged due to being pleased with the  current functional level.  ?????    Charlese Gruetzmacher P. HHelene KelpPT, MPH 02/11/16 1:01 PM

## 2016-02-11 NOTE — Patient Instructions (Addendum)
Stretch out strap - [employer]     Strengthening: Hip Abductor - Resisted    With band looped around both legs above knees, push thighs apart. Repeat __10__ times per set. Do _1-2___ sets per session. Do __1__ sessions per day.  Bridging    Slowly raise buttocks from floor, keeping stomach tight. Repeat _10___ times per set. Do _1-3___ sets per session. Do __1__ sessions per day.    Marland Kitchen

## 2016-02-16 ENCOUNTER — Encounter: Payer: Self-pay | Admitting: Family Medicine

## 2016-02-16 ENCOUNTER — Ambulatory Visit (INDEPENDENT_AMBULATORY_CARE_PROVIDER_SITE_OTHER): Payer: Medicare Other | Admitting: Family Medicine

## 2016-02-16 VITALS — BP 129/64 | HR 71 | Wt 116.0 lb

## 2016-02-16 DIAGNOSIS — M25562 Pain in left knee: Secondary | ICD-10-CM

## 2016-02-16 DIAGNOSIS — G8929 Other chronic pain: Secondary | ICD-10-CM

## 2016-02-16 DIAGNOSIS — M25561 Pain in right knee: Secondary | ICD-10-CM

## 2016-02-16 NOTE — Progress Notes (Signed)
Leslie Guzman is a 75 y.o. female who presents to [place]: [place] today for follow-up bilateral knee pain. Patient has been seen previously for bilateral knee pain. She received an injection a few months ago and did very well until recently. She developed recurrent swelling stiffness and pain bilaterally. She denies any recent injury. No fevers or chills.   No past medical history on file. Past Surgical History:  Procedure Laterality Date  . COSMETIC SURGERY    . KNEE SURGERY  1991  . TUBAL LIGATION  1991   Social History  Substance Use Topics  . Smoking status: Former Research scientist (life sciences)  . Smokeless tobacco: Never Used  . Alcohol use No   family history is not on file.  ROS as above:  Medications: Current Outpatient Prescriptions  Medication Sig Dispense Refill  . salsalate (DISALCID) 500 MG tablet Take 2 tablets (1,000 mg total) by mouth 2 (two) times daily. Take with food (Patient not taking: Reported on 02/16/2016) 60 tablet 1   No current facility-administered medications for this visit.    No Known Allergies  Health Maintenance Health Maintenance  Topic Date Due  . TETANUS/TDAP  02/12/1961  . MAMMOGRAM  02/13/1992  . COLONOSCOPY  02/13/1992  . ZOSTAVAX  02/12/2002  . DEXA SCAN  02/13/2007  . PNA vac Low Risk Adult (1 of 2 - PCV13) 02/13/2007  . INFLUENZA VACCINE  11/30/2015     Exam:  BP 129/64   Pulse 71   Wt 116 lb (52.6 kg)   BMI 20.55 kg/m  Gen: Well NAD Knees bilaterally with mild effusion. Normal motion. Nontender. Stable ligamentous exam.  Procedure: Real-time Ultrasound Guided Injection of right knee  Device: GE Logiq E  Images permanently stored and available for review in the ultrasound unit. Verbal informed consent obtained. Discussed risks and benefits of procedure. Warned about infection bleeding damage to structures skin  hypopigmentation and fat atrophy among others. Patient expresses understanding and agreement Time-out conducted.  Noted no overlying erythema, induration, or other signs of local infection.  Skin prepped in a sterile fashion.  Local anesthesia: Topical Ethyl chloride.  With sterile technique and under real time ultrasound guidance: 80 mg of Kenalog and 4 mL of Marcaine injected easily.  Completed without difficulty  Pain immediately resolved suggesting accurate placement of the medication.  Advised to call if fevers/chills, erythema, induration, drainage, or persistent bleeding.  Images permanently stored and available for review in the ultrasound unit.  Impression: Technically successful ultrasound guided injection.   Procedure: Real-time Ultrasound Guided Injection of left knee  Device: GE Logiq E  Images permanently stored and available for review in the ultrasound unit. Verbal informed consent obtained. Discussed risks and benefits of procedure. Warned about infection bleeding damage to structures skin hypopigmentation and fat atrophy among others. Patient expresses understanding and agreement Time-out conducted.  Noted no overlying erythema, induration, or other signs of local infection.  Skin prepped in a sterile fashion.  Local anesthesia: Topical Ethyl chloride.  With sterile technique and under real time ultrasound guidance: 80 mg of Kenalog and 4 mL of Marcaine injected easily.  Completed without difficulty  Pain immediately resolved suggesting accurate placement of the medication.  Advised to call if fevers/chills, erythema, induration, drainage, or persistent bleeding.  Images permanently stored and available for review in the ultrasound unit.  Impression: Technically successful ultrasound guided injection.  Lot numbers: Marcaine: [id number] Kenalog: [id number]  No results found for this or  any previous visit (from the past 72 hour(s)). No results  found.    Assessment and Plan: 75 y.o. 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 Discussed options. Plan for repeat steroid knee injection. Return for recheck  as needed.   No orders of the defined types were placed in this encounter.   Discussed warning signs or symptoms. Please see discharge instructions. Patient expresses understanding.

## 2016-02-16 NOTE — Patient Instructions (Signed)
Thank you for coming in today.   Call or go to the ER if you develop a large red swollen joint with extreme pain or oozing puss.    Return as needed.  

## 2016-03-08 DIAGNOSIS — H2511 Age-related nuclear cataract, right eye: Secondary | ICD-10-CM | POA: Diagnosis not present

## 2016-03-08 DIAGNOSIS — H40002 Preglaucoma, unspecified, left eye: Secondary | ICD-10-CM | POA: Diagnosis not present

## 2016-03-08 DIAGNOSIS — H2513 Age-related nuclear cataract, bilateral: Secondary | ICD-10-CM | POA: Diagnosis not present

## 2016-03-15 DIAGNOSIS — H527 Unspecified disorder of refraction: Secondary | ICD-10-CM | POA: Diagnosis not present

## 2016-03-15 DIAGNOSIS — H2510 Age-related nuclear cataract, unspecified eye: Secondary | ICD-10-CM | POA: Diagnosis not present

## 2016-06-23 DIAGNOSIS — M9903 Segmental and somatic dysfunction of lumbar region: Secondary | ICD-10-CM | POA: Diagnosis not present

## 2016-06-23 DIAGNOSIS — M5413 Radiculopathy, cervicothoracic region: Secondary | ICD-10-CM | POA: Diagnosis not present

## 2016-06-23 DIAGNOSIS — M5432 Sciatica, left side: Secondary | ICD-10-CM | POA: Diagnosis not present

## 2016-06-23 DIAGNOSIS — M9901 Segmental and somatic dysfunction of cervical region: Secondary | ICD-10-CM | POA: Diagnosis not present

## 2016-09-23 IMAGING — DX DG KNEE COMPLETE 4+V*R*
3 series · 3 of 3 positions shown · non-contrast
Comparison: None.

CLINICAL DATA: Bilateral knee pain.

EXAM:
RIGHT KNEE - COMPLETE 4+ VIEW

[knee lat]
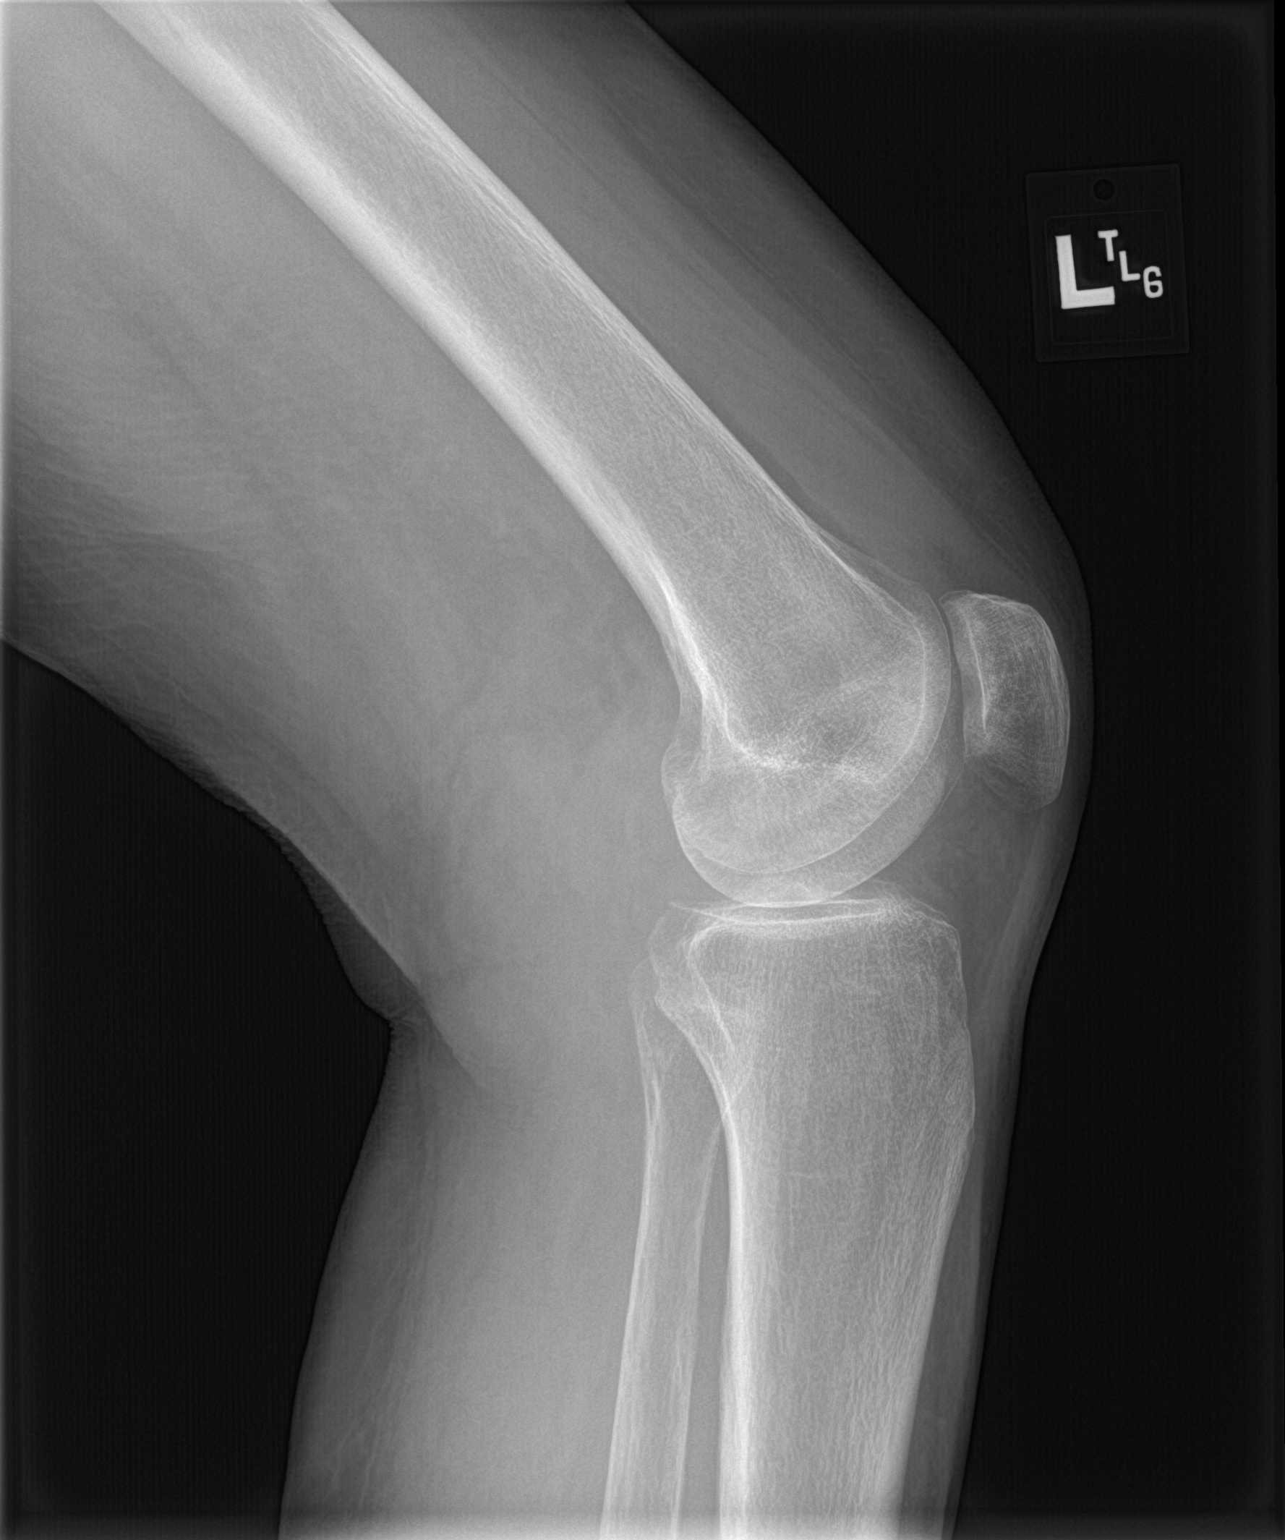

[knee sunrise]
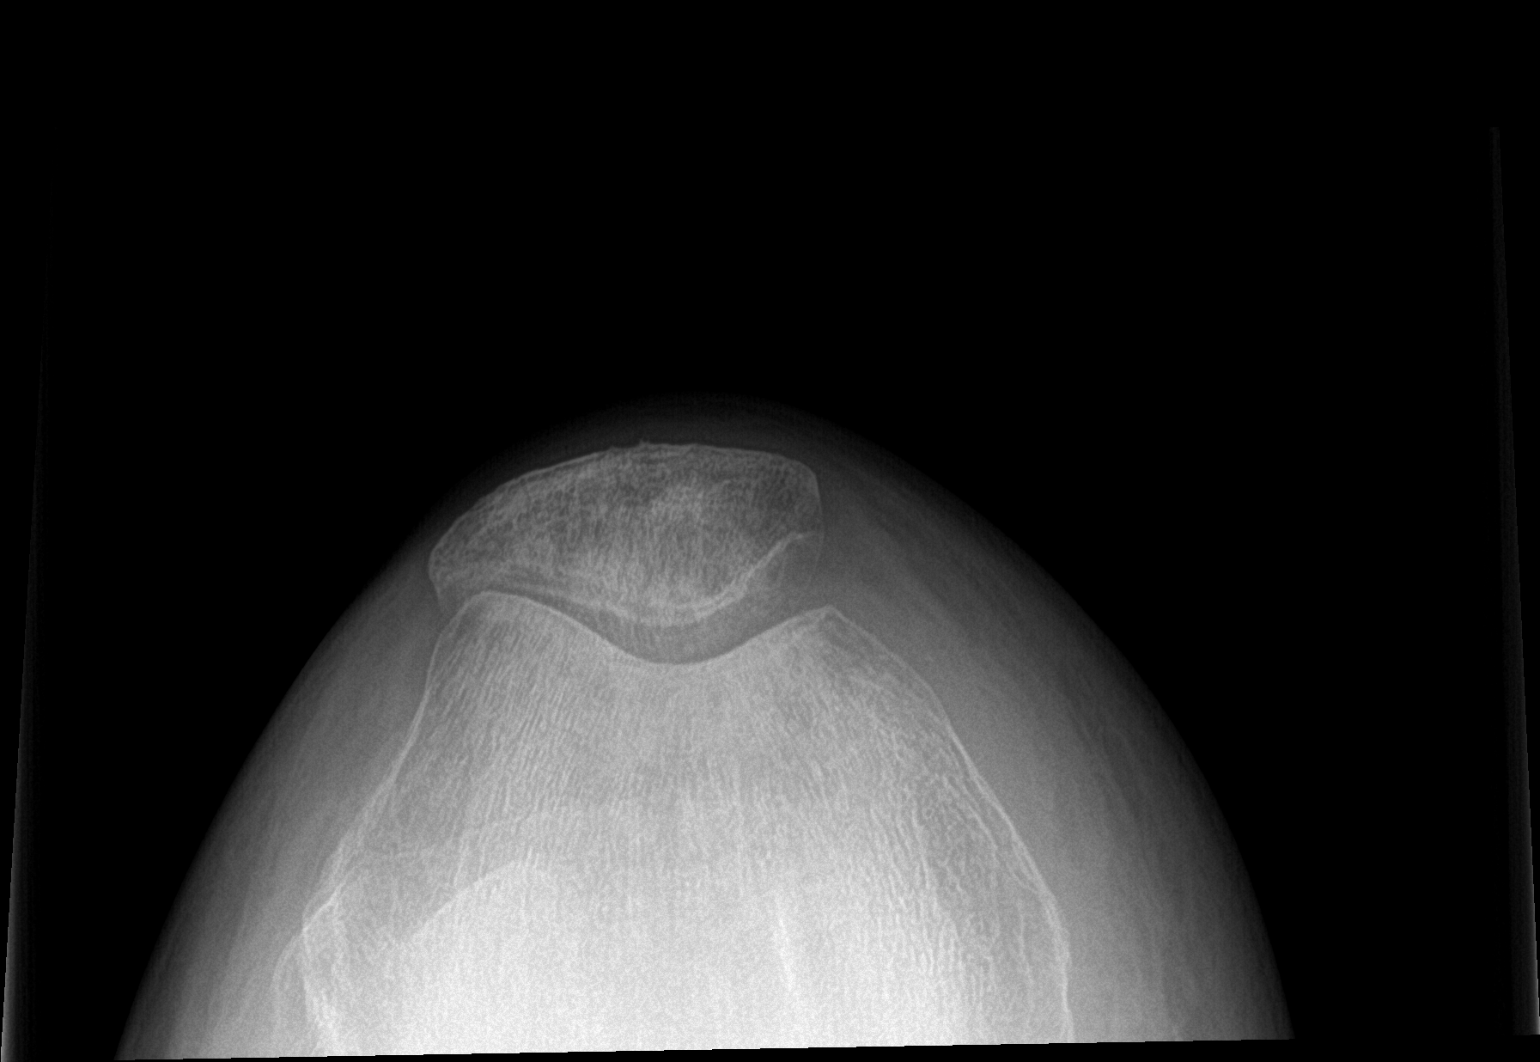

[knee ap bilat standing]
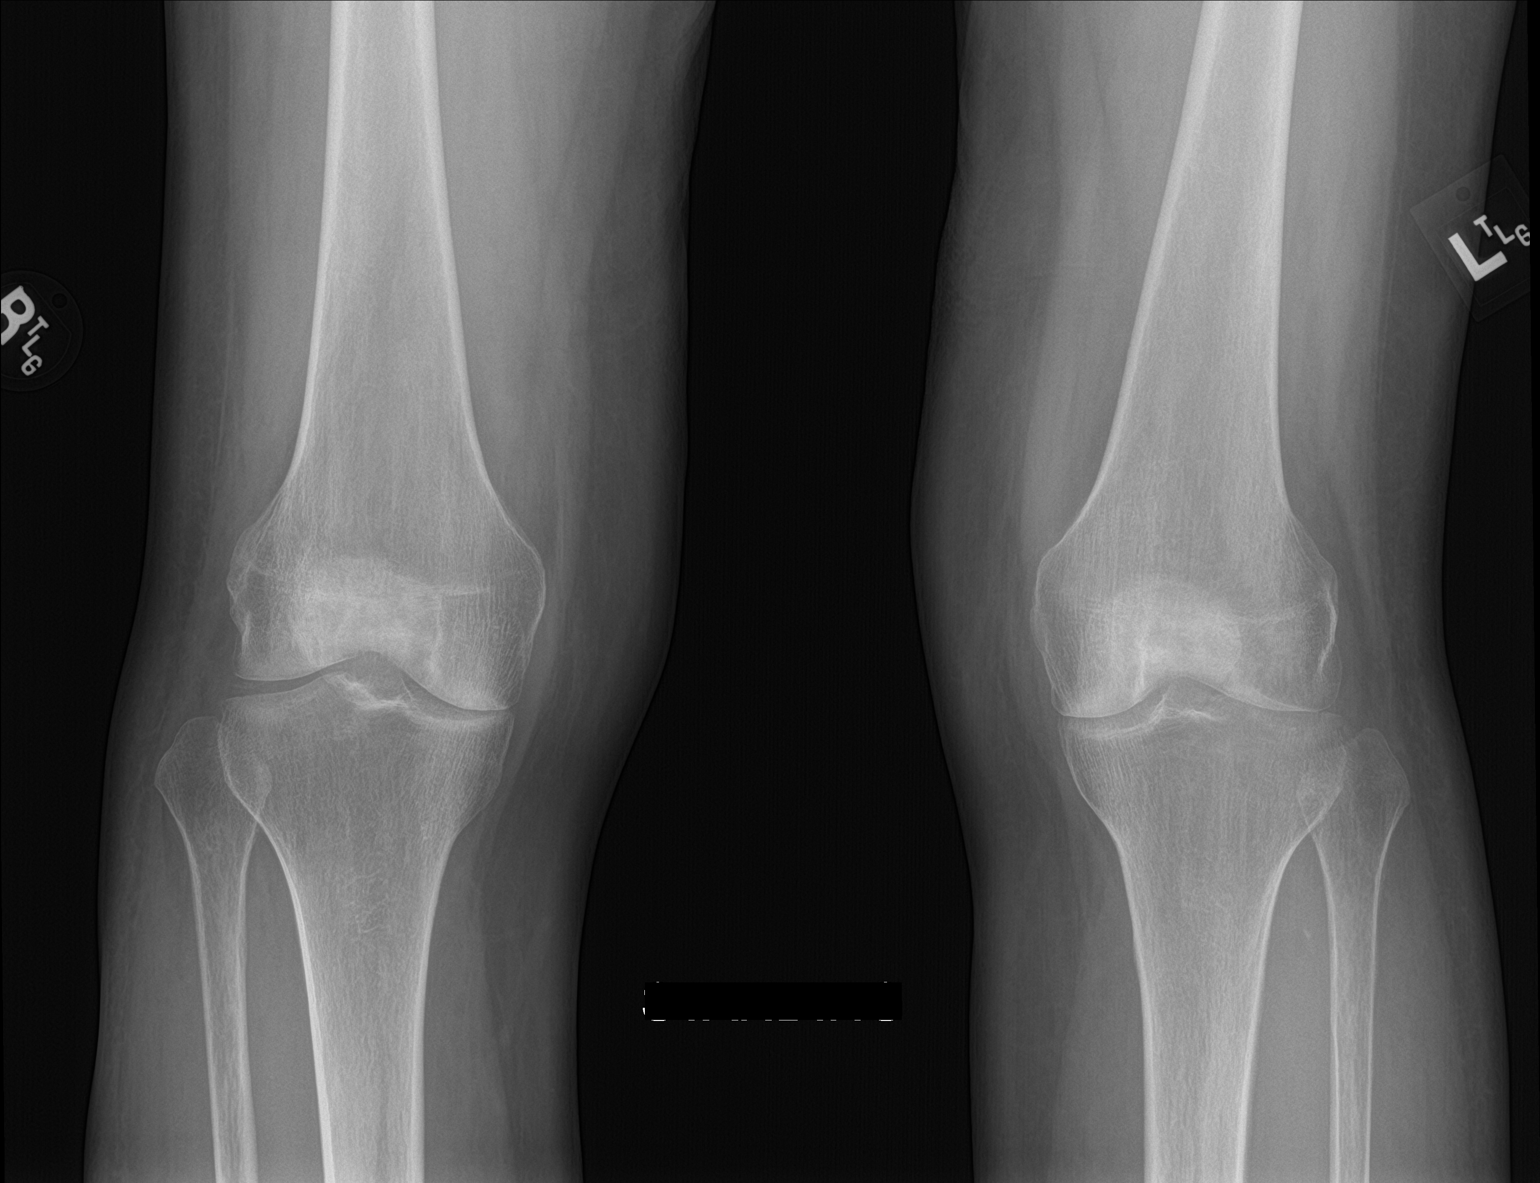

[3 of 3 positions shown; findings below may reference images not displayed]

FINDINGS: Joint space narrowing in the medial compartment. No visible joint
effusion. No acute bony abnormality. Specifically, no fracture,
subluxation, or dislocation. Soft tissues are intact.
IMPRESSION: Mild joint space narrowing in the medial compartment. No acute bony
abnormality.

## 2017-01-11 DIAGNOSIS — M9901 Segmental and somatic dysfunction of cervical region: Secondary | ICD-10-CM | POA: Diagnosis not present

## 2017-01-11 DIAGNOSIS — M5134 Other intervertebral disc degeneration, thoracic region: Secondary | ICD-10-CM | POA: Diagnosis not present

## 2017-01-15 DIAGNOSIS — M5134 Other intervertebral disc degeneration, thoracic region: Secondary | ICD-10-CM | POA: Diagnosis not present

## 2017-01-15 DIAGNOSIS — M9901 Segmental and somatic dysfunction of cervical region: Secondary | ICD-10-CM | POA: Diagnosis not present

## 2017-01-17 DIAGNOSIS — M9901 Segmental and somatic dysfunction of cervical region: Secondary | ICD-10-CM | POA: Diagnosis not present

## 2017-01-17 DIAGNOSIS — M5134 Other intervertebral disc degeneration, thoracic region: Secondary | ICD-10-CM | POA: Diagnosis not present

## 2017-01-22 DIAGNOSIS — M9901 Segmental and somatic dysfunction of cervical region: Secondary | ICD-10-CM | POA: Diagnosis not present

## 2017-01-22 DIAGNOSIS — M5134 Other intervertebral disc degeneration, thoracic region: Secondary | ICD-10-CM | POA: Diagnosis not present

## 2017-01-25 DIAGNOSIS — M5134 Other intervertebral disc degeneration, thoracic region: Secondary | ICD-10-CM | POA: Diagnosis not present

## 2017-01-25 DIAGNOSIS — M9901 Segmental and somatic dysfunction of cervical region: Secondary | ICD-10-CM | POA: Diagnosis not present

## 2017-01-29 DIAGNOSIS — M5134 Other intervertebral disc degeneration, thoracic region: Secondary | ICD-10-CM | POA: Diagnosis not present

## 2017-01-29 DIAGNOSIS — M9901 Segmental and somatic dysfunction of cervical region: Secondary | ICD-10-CM | POA: Diagnosis not present

## 2017-02-01 DIAGNOSIS — M9901 Segmental and somatic dysfunction of cervical region: Secondary | ICD-10-CM | POA: Diagnosis not present

## 2017-02-01 DIAGNOSIS — M5134 Other intervertebral disc degeneration, thoracic region: Secondary | ICD-10-CM | POA: Diagnosis not present

## 2017-02-05 DIAGNOSIS — M5134 Other intervertebral disc degeneration, thoracic region: Secondary | ICD-10-CM | POA: Diagnosis not present

## 2017-02-05 DIAGNOSIS — M9901 Segmental and somatic dysfunction of cervical region: Secondary | ICD-10-CM | POA: Diagnosis not present

## 2017-02-08 DIAGNOSIS — M9901 Segmental and somatic dysfunction of cervical region: Secondary | ICD-10-CM | POA: Diagnosis not present

## 2017-02-08 DIAGNOSIS — M5134 Other intervertebral disc degeneration, thoracic region: Secondary | ICD-10-CM | POA: Diagnosis not present

## 2017-02-12 DIAGNOSIS — M5134 Other intervertebral disc degeneration, thoracic region: Secondary | ICD-10-CM | POA: Diagnosis not present

## 2017-02-12 DIAGNOSIS — M9901 Segmental and somatic dysfunction of cervical region: Secondary | ICD-10-CM | POA: Diagnosis not present

## 2017-02-19 DIAGNOSIS — M9901 Segmental and somatic dysfunction of cervical region: Secondary | ICD-10-CM | POA: Diagnosis not present

## 2017-02-19 DIAGNOSIS — M5134 Other intervertebral disc degeneration, thoracic region: Secondary | ICD-10-CM | POA: Diagnosis not present

## 2017-02-22 DIAGNOSIS — M9901 Segmental and somatic dysfunction of cervical region: Secondary | ICD-10-CM | POA: Diagnosis not present

## 2017-02-22 DIAGNOSIS — M5134 Other intervertebral disc degeneration, thoracic region: Secondary | ICD-10-CM | POA: Diagnosis not present

## 2018-01-28 DIAGNOSIS — C44319 Basal cell carcinoma of skin of other parts of face: Secondary | ICD-10-CM | POA: Diagnosis not present

## 2018-01-28 DIAGNOSIS — C44612 Basal cell carcinoma of skin of right upper limb, including shoulder: Secondary | ICD-10-CM | POA: Diagnosis not present

## 2018-01-28 DIAGNOSIS — D485 Neoplasm of uncertain behavior of skin: Secondary | ICD-10-CM | POA: Diagnosis not present

## 2018-01-28 DIAGNOSIS — Z23 Encounter for immunization: Secondary | ICD-10-CM | POA: Diagnosis not present

## 2018-03-07 DIAGNOSIS — Z23 Encounter for immunization: Secondary | ICD-10-CM | POA: Diagnosis not present

## 2018-03-07 DIAGNOSIS — C44319 Basal cell carcinoma of skin of other parts of face: Secondary | ICD-10-CM | POA: Diagnosis not present

## 2018-03-07 DIAGNOSIS — D225 Melanocytic nevi of trunk: Secondary | ICD-10-CM | POA: Diagnosis not present

## 2018-03-07 DIAGNOSIS — Z85828 Personal history of other malignant neoplasm of skin: Secondary | ICD-10-CM | POA: Diagnosis not present

## 2018-03-07 DIAGNOSIS — C44519 Basal cell carcinoma of skin of other part of trunk: Secondary | ICD-10-CM | POA: Diagnosis not present

## 2018-03-07 DIAGNOSIS — C44310 Basal cell carcinoma of skin of unspecified parts of face: Secondary | ICD-10-CM | POA: Diagnosis not present

## 2018-03-07 DIAGNOSIS — C44612 Basal cell carcinoma of skin of right upper limb, including shoulder: Secondary | ICD-10-CM | POA: Diagnosis not present

## 2018-03-07 DIAGNOSIS — D485 Neoplasm of uncertain behavior of skin: Secondary | ICD-10-CM | POA: Diagnosis not present

## 2018-05-08 DIAGNOSIS — C44319 Basal cell carcinoma of skin of other parts of face: Secondary | ICD-10-CM | POA: Diagnosis not present

## 2018-06-05 DIAGNOSIS — C44519 Basal cell carcinoma of skin of other part of trunk: Secondary | ICD-10-CM | POA: Diagnosis not present

## 2018-06-12 DIAGNOSIS — C4441 Basal cell carcinoma of skin of scalp and neck: Secondary | ICD-10-CM | POA: Diagnosis not present

## 2018-12-02 ENCOUNTER — Other Ambulatory Visit: Payer: Self-pay
